# Patient Record
Sex: Male | Born: 1960 | Race: White | Hispanic: No | Marital: Married | Smoking: Former smoker
Health system: Southern US, Community
[De-identification: ages and names within clinical notes are randomized; demographics above are authoritative.]

## PROBLEM LIST (undated history)

## (undated) DIAGNOSIS — D72829 Elevated white blood cell count, unspecified: Secondary | ICD-10-CM

## (undated) DIAGNOSIS — J449 Chronic obstructive pulmonary disease, unspecified: Secondary | ICD-10-CM

## (undated) HISTORY — DX: Elevated white blood cell count, unspecified: D72.829

---

## 2009-07-02 ENCOUNTER — Ambulatory Visit: Admission: RE | Admit: 2009-07-02 | Discharge: 2009-07-02 | Payer: Self-pay | Admitting: Family Medicine

## 2012-11-17 ENCOUNTER — Ambulatory Visit (INDEPENDENT_AMBULATORY_CARE_PROVIDER_SITE_OTHER): Payer: BC Managed Care – PPO | Admitting: Physician Assistant

## 2012-11-17 ENCOUNTER — Ambulatory Visit (INDEPENDENT_AMBULATORY_CARE_PROVIDER_SITE_OTHER): Payer: BC Managed Care – PPO

## 2012-11-17 ENCOUNTER — Encounter: Payer: Self-pay | Admitting: Physician Assistant

## 2012-11-17 VITALS — BP 119/76 | HR 86 | Temp 99.3°F | Ht 68.0 in | Wt <= 1120 oz

## 2012-11-17 DIAGNOSIS — D72829 Elevated white blood cell count, unspecified: Secondary | ICD-10-CM

## 2012-11-17 DIAGNOSIS — F17200 Nicotine dependence, unspecified, uncomplicated: Secondary | ICD-10-CM

## 2012-11-17 DIAGNOSIS — IMO0001 Reserved for inherently not codable concepts without codable children: Secondary | ICD-10-CM

## 2012-11-17 NOTE — Progress Notes (Signed)
Subjective:     Patient ID: George Eaton, male   DOB: [birth year], 52 y.o.   MRN: [id number]  HPI Pt here for follow up He has been seen regular through the clinic set up at his work place They have noted elevated white count for several months and the pt states he has been told this for years He denies night sweats, weight gain/loss, or any swollen glands No know dental problems and pt has full dentures He has smoked since the age of 89 and is currently ~ 1 1/2 pk per day No FH of blood conditions  Review of Systems  All other systems reviewed and are negative.       Objective:   Physical Exam  Nursing note and vitals reviewed. Oral- no lesions No cerv nodes No bruits Heart- RRR w/o M Lungs- CTA CXR- chronic change, will be over-read Reviewed labs pt brings to appt- WBC count between 13-16     Assessment:     1. Leukocytosis, unspecified   2. Smoking        Plan:     Given chronic nature and no definite cause refer to hematology Pt prefers [place] F/U pending referral

## 2012-11-17 NOTE — Patient Instructions (Signed)
Leukocytosis Leukocytosis means you have more white blood cells than normal. White blood cells are made in your bone marrow. The main job of white blood cells is to fight infection. Having too many white blood cells is a common condition. It can develop as a result of many types of medical problems. CAUSES  In some cases, your bone marrow may be normal, but it is still making too many white blood cells. This could be the result of:  Infection.  Injury.  Physical stress.  Emotional stress.  Surgery.  Allergic reactions.  Tumors that do not start in the blood or bone marrow.  An inherited disease.  Certain medicines.  Pregnancy and labor. In other cases, you may have a bone marrow disorder that is causing your body to make too many white blood cells. Bone marrow disorders include:  Leukemia. This is a type of blood cancer.  Myeloproliferative disorders. These disorders cause blood cells to grow abnormally. SYMPTOMS  Some people have no symptoms. Others have symptoms due to the medical problem that is causing their leukocytosis. These symptoms may include:  Bleeding.  Bruising.  Fever.  Night sweats.  Repeated infections.  Weakness.  Weight loss. DIAGNOSIS  Leukocytosis is often found during blood tests that are done as part of a normal physical exam. Your caregiver will probably order other tests to help determine why you have too many white blood cells. These tests may include:  A complete blood count (CBC). This test measures all the types of blood cells in your body.  Chest X-rays, urine tests (urinalysis), or other tests to look for signs of infection.  Bone marrow aspiration. For this test, a needle is put into your bone. Cells from the bone marrow are removed through the needle. The cells are then examined under a microscope. TREATMENT  Treatment is usually not needed for leukocytosis. However, if a disorder is causing your leukocytosis, it will need to be  treated. Treatment may include:  Antibiotic medicines if you have a bacterial infection.  Bone marrow transplant. Your diseased bone marrow is replaced with healthy cells that will grow new bone marrow.  Chemotherapy. This is the use of drugs to kill cancer cells. HOME CARE INSTRUCTIONS  Only take over-the-counter or prescription medicines as directed by your caregiver.  Maintain a healthy weight. Ask your caregiver what weight is best for you.  Eat foods that are low in saturated fats and high in fiber. Eat plenty of fruits and vegetables.  Drink enough fluids to keep your urine clear or pale yellow.  Get 30 minutes of exercise at least 5 times a week. Check with your caregiver before starting a new exercise routine.  Limit caffeine and alcohol.  Do not smoke.  Keep all follow-up appointments as directed by your caregiver. SEEK MEDICAL CARE IF:  You feel weak or more tired than usual.  You develop chills, a cough, or nasal congestion.  You lose weight without trying.  You have night sweats.  You bruise easily. SEEK IMMEDIATE MEDICAL CARE IF:  You bleed more than normal.  You have chest pain.  You have trouble breathing.  You have a fever.  You have uncontrolled nausea or vomiting.  You feel dizzy or lightheaded. MAKE SURE YOU:  Understand these instructions.  Will watch your condition.  Will get help right away if you are not doing well or get worse. Document Released: 05/15/2011 Document Revised: 08/18/2011 Document Reviewed: 05/15/2011 [employer] Patient Information 2014 [employer].  

## 2012-12-17 ENCOUNTER — Encounter (HOSPITAL_COMMUNITY): Payer: Self-pay

## 2012-12-17 ENCOUNTER — Encounter (HOSPITAL_COMMUNITY): Payer: BC Managed Care – PPO | Attending: Oncology

## 2012-12-17 VITALS — BP 127/86 | HR 94 | Temp 98.6°F | Resp 18 | Ht 67.0 in | Wt 229.0 lb

## 2012-12-17 DIAGNOSIS — D72829 Elevated white blood cell count, unspecified: Secondary | ICD-10-CM

## 2012-12-17 NOTE — Patient Instructions (Addendum)
.  [facility] Discharge Instructions  RECOMMENDATIONS MADE BY THE CONSULTANT AND ANY TEST RESULTS WILL BE SENT TO YOUR REFERRING PHYSICIAN.  EXAM FINDINGS BY THE PHYSICIAN TODAY AND SIGNS OR SYMPTOMS TO REPORT TO CLINIC OR PRIMARY PHYSICIAN:  Lab work to be done Monday 7/14 at 0930 SPECIAL INSTRUCTIONS/FOLLOW-UP: Return in 1 month for labs and to see our PA.  Thank you for choosing [facility] to provide your oncology and hematology care.  To afford each patient quality time with our providers, please arrive at least 15 minutes before your scheduled appointment time.  With your help, our goal is to use those 15 minutes to complete the necessary work-up to ensure our physicians have the information they need to help with your evaluation and healthcare recommendations.    Effective January 1st, 2014, we ask that you re-schedule your appointment with our physicians should you arrive 10 or more minutes late for your appointment.  We strive to give you quality time with our providers, and arriving late affects you and other patients whose appointments are after yours.    Again, thank you for choosing [facility].  Our hope is that these requests will decrease the amount of time that you wait before being seen by our physicians.       _____________________________________________________________  Should you have questions after your visit to [facility], please contact our office at [phone] between the hours of 8:30 a.m. and 5:00 p.m.  Voicemails left after 4:30 p.m. will not be returned until the following business day.  For prescription refill requests, have your pharmacy contact our office with your prescription refill request.

## 2012-12-20 ENCOUNTER — Encounter (HOSPITAL_BASED_OUTPATIENT_CLINIC_OR_DEPARTMENT_OTHER): Payer: BC Managed Care – PPO

## 2012-12-20 DIAGNOSIS — D72829 Elevated white blood cell count, unspecified: Secondary | ICD-10-CM

## 2012-12-20 LAB — CBC
HCT: 45.7 % (ref 39.0–52.0)
Hemoglobin: 16.1 g/dL (ref 13.0–17.0)
MCV: 88.7 fL (ref 78.0–100.0)
RDW: 13 % (ref 11.5–15.5)
WBC: 12.3 10*3/uL — ABNORMAL HIGH (ref 4.0–10.5)

## 2012-12-20 NOTE — Progress Notes (Signed)
George Eaton's reason for visit today are for labs as scheduled per MD orders.  Venipuncture performed with a 23 gauge butterfly needle to R Antecubital.  George Eaton tolerated venipuncture well and without incident; questions were answered and patient was discharged.

## 2012-12-20 NOTE — Progress Notes (Signed)
Chief complaint: Persistent leukocytosis.  History of present illness:   Patient is a 52 year old male with no significant past medical history is found to have a mildly elevated white blood cell count on routine blood work.  Repeat lab draw is confirmed the results.  Currently, he feels well and is asymptomatic.  He has no neurologic complaints.  He denies any recent fevers or illnesses.  He is a good appetite and denies weight loss.  He has no night sweats.  He denies any chest pain or shortness of breath.  He denies any nausea, vomiting, constipation, or diarrhea.  He has no melena or hematochezia.  He has no urinary complaints.  Patient feels at his baseline and offers no specific complaints today.  Active Ambulatory Problems    Diagnosis Date Noted  . No Active Ambulatory Problems   Resolved Ambulatory Problems    Diagnosis Date Noted  . No Resolved Ambulatory Problems   No Additional Past Medical History    As per HPI. Otherwise, 10 point system review was negative.   Filed Vitals:   12/17/12 1434  BP: 127/86  Pulse: 94  Temp: 98.6 F (37 C)  Resp: 18    CBC    Component Value Date/Time   WBC 12.3* 12/20/2012 0911   RBC 5.15 12/20/2012 0911   HGB 16.1 12/20/2012 0911   HCT 45.7 12/20/2012 0911   PLT 287 12/20/2012 0911   MCV 88.7 12/20/2012 0911   MCH 31.3 12/20/2012 0911   MCHC 35.2 12/20/2012 0911   RDW 13.0 12/20/2012 0911     Assessment and plan:  1.  Leukocytosis: Patient's white blood cell count continues to be mildly elevated.  The remainder of his laboratory work from today was within normal limits.  Because of the neutrophil predominance, have ordered a BCR/abl gene rearrangement to assess for underlying CML.  This is most likely reactive in nature possibly secondary to his heavy tobacco use.  No intervention is needed at this time.  Patient does not require bone marrow biopsy at this time.  Return to clinic in 1 month with repeat laboratory work and further  evaluation.  If his BCR-abl gene rearrangement is negative, he likely can be discharged from clinic.  Patient expressed understanding and was in agreement with this plan.

## 2012-12-22 LAB — BCR/ABL GENE REARRANGEMENT QNT, PCR: BCR ABL1 / ABL1 IS: 0 %

## 2013-01-20 ENCOUNTER — Encounter (HOSPITAL_BASED_OUTPATIENT_CLINIC_OR_DEPARTMENT_OTHER): Payer: BC Managed Care – PPO

## 2013-01-20 ENCOUNTER — Encounter (HOSPITAL_COMMUNITY): Payer: BC Managed Care – PPO | Attending: Oncology | Admitting: Oncology

## 2013-01-20 ENCOUNTER — Encounter (HOSPITAL_COMMUNITY): Payer: Self-pay | Admitting: Oncology

## 2013-01-20 VITALS — BP 141/90 | HR 92 | Temp 97.8°F | Resp 18 | Wt 234.6 lb

## 2013-01-20 DIAGNOSIS — D72829 Elevated white blood cell count, unspecified: Secondary | ICD-10-CM

## 2013-01-20 DIAGNOSIS — F17200 Nicotine dependence, unspecified, uncomplicated: Secondary | ICD-10-CM

## 2013-01-20 HISTORY — DX: Elevated white blood cell count, unspecified: D72.829

## 2013-01-20 LAB — CBC
HCT: 45.7 % (ref 39.0–52.0)
Hemoglobin: 15.8 g/dL (ref 13.0–17.0)
MCV: 89.4 fL (ref 78.0–100.0)
RDW: 13.2 % (ref 11.5–15.5)
WBC: 10.4 10*3/uL (ref 4.0–10.5)

## 2013-01-20 NOTE — Progress Notes (Signed)
George Heap, MD [address] [place] [address]  Leukocytosis  CURRENT THERAPY: Work-up  INTERVAL HISTORY: George Eaton 52 y.o. male returns for  regular  visit for followup of persistent leukocytosis.  George Eaton denies any B symptoms.  He denies any fevers, chills, night sweats, nausea, vomiting, early satiety, unintentional weight loss. He feels well.   He reports that in the distant past he was told that he has COPD, but I do not have any radiographic evidence of that in [facility].   I personally reviewed and went over laboratory results with the patient.  His WBC is WNL most recently at 10.4 with a normal Hgb 15.8 g/dL and platelet count of 604,540.  His BCR/ABL was negative.    I have reviewed Dr. Milinda Cave note last month.  I personally reviewed and went over radiographic studies with the patient.  His last Chest Xray was negative for any acute findings.   The majority of our visit today was smoking cessation education.  I provided him education regarding the relationship of leukocytosis and smoking.   He is showing early signs of clubbing and I provided him education regarding this effect of smoking abuse.   We will release the patient from the clinic as their is not an oncologic or hematologic present at this time.  He is to follow-up with his PCP as directed.   Past Medical History  Diagnosis Date  . Leukocytosis 01/20/2013    has Leukocytosis on his problem list.     has No Known Allergies.  [name] does not currently have medications on file.  History reviewed. No pertinent past surgical history.  Denies any headaches, dizziness, double vision, fevers, chills, night sweats, nausea, vomiting, diarrhea, constipation, chest pain, heart palpitations, shortness of breath, blood in stool, black tarry stool, urinary pain, urinary burning, urinary frequency, hematuria.   PHYSICAL EXAMINATION  ECOG PERFORMANCE STATUS: 0 - Asymptomatic  Filed Vitals:   01/20/13 0800  BP: 141/90  Pulse: 92  Temp: 97.8 F (36.6 C)  Resp: 18    GENERAL:alert, no distress, well nourished, well developed, comfortable, cooperative, obese and smiling SKIN: skin color, texture, turgor are normal, no rashes or significant lesions HEAD: Normocephalic, No masses, lesions, tenderness or abnormalities EYES: normal, PERRLA, EOMI, Conjunctiva are pink and non-injected EARS: External ears normal OROPHARYNX:mucous membranes are moist  NECK: supple, no adenopathy, thyroid normal size, non-tender, without nodularity, no stridor, non-tender, trachea midline LYMPH:  no palpable lymphadenopathy BREAST:not examined LUNGS: clear to auscultation and percussion, decreased breath sounds and hyperresonant to percussion HEART: regular rate & rhythm, no murmurs, no gallops, S1 normal and S2 normal ABDOMEN:abdomen soft, non-tender, obese and normal bowel sounds BACK: Back symmetric, no curvature. EXTREMITIES:less then 2 second capillary refill, no joint deformities, effusion, or inflammation, no edema, no skin discoloration, no cyanosis, positive findings:  Early clubbing of fingernails  NEURO: alert & oriented x 3 with fluent speech, no focal motor/sensory deficits, gait normal    LABORATORY DATA: CBC    Component Value Date/Time   WBC 10.4 01/20/2013 0844   RBC 5.11 01/20/2013 0844   HGB 15.8 01/20/2013 0844   HCT 45.7 01/20/2013 0844   PLT 272 01/20/2013 0844   MCV 89.4 01/20/2013 0844   MCH 30.9 01/20/2013 0844   MCHC 34.6 01/20/2013 0844   RDW 13.2 01/20/2013 0844    BCR/ABL Source  REPORT   Comments: Peripheral Blood BCR ABL1 / ABL1 %  0.000   BCR ABL1 /  ABL1 IS %  0.000   Interpretation - BCRQ  REPORT   Comments: (NOTE) The P190 and P210 BCR-ABL1 fusion transcripts are NOT detected.     RADIOGRAPHIC STUDIES:  11/17/2012  *RADIOLOGY REPORT*  Clinical Data: Elevated WBC, smoker  CHEST - 2 VIEW  Comparison: None.  Findings: Cardiomediastinal silhouette  is unremarkable. No acute  infiltrate or pleural effusion. No pulmonary edema. Mild  degenerative changes thoracic spine.  IMPRESSION:  No active disease.  Clinically significant discrepancy from primary report, if  provided: None  Original Report Authenticated By: Natasha Mead, M.D.     ASSESSMENT:  1. Leukocytosis, WNL (upper normal limits) most recently on 01/20/2013.  Reactive to #2 2. Tobacco abuse, 1.5 ppd   PLAN:  1. I personally reviewed and went over laboratory results with the patient. 2. I personally reviewed and went over radiographic studies with the patient. 3. Smoking cessation education provided.  4. Patient education regarding leukocytosis 5. Release from clinic.   THERAPY PLAN:  Will release the patient from clinic with negative work-up for leukocytosis with neutrophilia.  He is to follow with PCP as directed.   All questions were answered. The patient knows to call the clinic with any problems, questions or concerns. We can certainly see the patient much sooner if necessary.  Patient and plan discussed with Dr. Benita Gutter and he is in agreement with the aforementioned.    KEFALAS,THOMAS

## 2013-01-20 NOTE — Patient Instructions (Addendum)
[facility] Discharge Instructions  RECOMMENDATIONS MADE BY THE CONSULTANT AND ANY TEST RESULTS WILL BE SENT TO YOUR REFERRING PHYSICIAN.  Your white cell counts have returned to normal. All other lab tests have been negative. PLEASE try to QUIT SMOKING. We will release you from our clinic. Please keep [place] in mind if you are ever in need of our services again.  Smoking Cessation Quitting smoking is important to your health and has many advantages. However, it is not always easy to quit since nicotine is a very addictive drug. Often times, people try 3 times or more before being able to quit. This document explains the best ways for you to prepare to quit smoking. Quitting takes hard work and a lot of effort, but you can do it. ADVANTAGES OF QUITTING SMOKING  You will live longer, feel better, and live better.  Your body will feel the impact of quitting smoking almost immediately.  Within 20 minutes, blood pressure decreases. Your pulse returns to its normal level.  After 8 hours, carbon monoxide levels in the blood return to normal. Your oxygen level increases.  After 24 hours, the chance of having a heart attack starts to decrease. Your breath, hair, and body stop smelling like smoke.  After 48 hours, damaged nerve endings begin to recover. Your sense of taste and smell improve.  After 72 hours, the body is virtually free of nicotine. Your bronchial tubes relax and breathing becomes easier.  After 2 to 12 weeks, lungs can hold more air. Exercise becomes easier and circulation improves.  The risk of having a heart attack, stroke, cancer, or lung disease is greatly reduced.  After 1 year, the risk of coronary heart disease is cut in half.  After 5 years, the risk of stroke falls to the same as a nonsmoker.  After 10 years, the risk of lung cancer is cut in half and the risk of other cancers decreases significantly.  After 15 years, the risk of coronary heart  disease drops, usually to the level of a nonsmoker.  If you are pregnant, quitting smoking will improve your chances of having a healthy baby.  The people you live with, especially any children, will be healthier.  You will have extra money to spend on things other than cigarettes. QUESTIONS TO THINK ABOUT BEFORE ATTEMPTING TO QUIT You may want to talk about your answers with your caregiver.  Why do you want to quit?  If you tried to quit in the past, what helped and what did not?  What will be the most difficult situations for you after you quit? How will you plan to handle them?  Who can help you through the tough times? Your family? Friends? A caregiver?  What pleasures do you get from smoking? What ways can you still get pleasure if you quit? Here are some questions to ask your caregiver:  How can you help me to be successful at quitting?  What medicine do you think would be best for me and how should I take it?  What should I do if I need more help?  What is smoking withdrawal like? How can I get information on withdrawal? GET READY  Set a quit date.  Change your environment by getting rid of all cigarettes, ashtrays, matches, and lighters in your home, car, or work. Do not let people smoke in your home.  Review your past attempts to quit. Think about what worked and what did not. GET SUPPORT AND  ENCOURAGEMENT You have a better chance of being successful if you have help. You can get support in many ways.  Tell your family, friends, and co-workers that you are going to quit and need their support. Ask them not to smoke around you.  Get individual, group, or telephone counseling and support. Programs are available at [employer] and health centers. Call your local health department for information about programs in your area.  Spiritual beliefs and practices may help some smokers quit.  Download a "quit meter" on your computer to keep track of quit statistics, such  as how long you have gone without smoking, cigarettes not smoked, and money saved.  Get a self-help book about quitting smoking and staying off of tobacco. LEARN Merlean Pizzini SKILLS AND BEHAVIORS  Distract yourself from urges to smoke. Talk to someone, go for a walk, or occupy your time with a task.  Change your normal routine. Take a different route to work. Drink tea instead of coffee. Eat breakfast in a different place.  Reduce your stress. Take a hot bath, exercise, or read a book.  Plan something enjoyable to do every day. Reward yourself for not smoking.  Explore interactive web-based programs that specialize in helping you quit. GET MEDICINE AND USE IT CORRECTLY Medicines can help you stop smoking and decrease the urge to smoke. Combining medicine with the above behavioral methods and support can greatly increase your chances of successfully quitting smoking.  Nicotine replacement therapy helps deliver nicotine to your body without the negative effects and risks of smoking. Nicotine replacement therapy includes nicotine gum, lozenges, inhalers, nasal sprays, and skin patches. Some may be available over-the-counter and others require a prescription.  Antidepressant medicine helps people abstain from smoking, but how this works is unknown. This medicine is available by prescription.  Nicotinic receptor partial agonist medicine simulates the effect of nicotine in your brain. This medicine is available by prescription. Ask your caregiver for advice about which medicines to use and how to use them based on your health history. Your caregiver will tell you what side effects to look out for if you choose to be on a medicine or therapy. Carefully read the information on the package. Do not use any other product containing nicotine while using a nicotine replacement product.  RELAPSE OR DIFFICULT SITUATIONS Most relapses occur within the first 3 months after quitting. Do not be discouraged if you start  smoking again. Remember, most people try several times before finally quitting. You may have symptoms of withdrawal because your body is used to nicotine. You may crave cigarettes, be irritable, feel very hungry, cough often, get headaches, or have difficulty concentrating. The withdrawal symptoms are only temporary. They are strongest when you first quit, but they will go away within 10 14 days. To reduce the chances of relapse, try to:  Avoid drinking alcohol. Drinking lowers your chances of successfully quitting.  Reduce the amount of caffeine you consume. Once you quit smoking, the amount of caffeine in your body increases and can give you symptoms, such as a rapid heartbeat, sweating, and anxiety.  Avoid smokers because they can make you want to smoke.  Do not let weight gain distract you. Many smokers will gain weight when they quit, usually less than 10 pounds. Eat a healthy diet and stay active. You can always lose the weight gained after you quit.  Find ways to improve your mood other than smoking. FOR MORE INFORMATION  www.smokefree.gov  Document Released: 05/20/2001 Document Revised:  11/25/2011 Document Reviewed: 09/04/2011 ExitCare Patient Information 2014 [place], [place].   Thank you for choosing [facility] to provide your oncology and hematology care.  To afford each patient quality time with our providers, please arrive at least 15 minutes before your scheduled appointment time.  With your help, our goal is to use those 15 minutes to complete the necessary work-up to ensure our physicians have the information they need to help with your evaluation and healthcare recommendations.    Effective January 1st, 2014, we ask that you re-schedule your appointment with our physicians should you arrive 10 or more minutes late for your appointment.  We strive to give you quality time with our providers, and arriving late affects you and other patients whose appointments are after  yours.    Again, thank you for choosing [facility].  Our hope is that these requests will decrease the amount of time that you wait before being seen by our physicians.       _____________________________________________________________  Should you have questions after your visit to [facility], please contact our office at [phone] between the hours of 8:30 a.m. and 5:00 p.m.  Voicemails left after 4:30 p.m. will not be returned until the following business day.  For prescription refill requests, have your pharmacy contact our office with your prescription refill request.

## 2013-01-20 NOTE — Progress Notes (Signed)
Labs drawn today for cbc 

## 2014-10-06 IMAGING — CR DG CHEST 2V
2 series · 2 of 2 positions shown · non-contrast
Comparison: None.

CLINICAL DATA: Elevated WBC, smoker

CHEST - 2 VIEW

[view not recorded (1 of 2)]
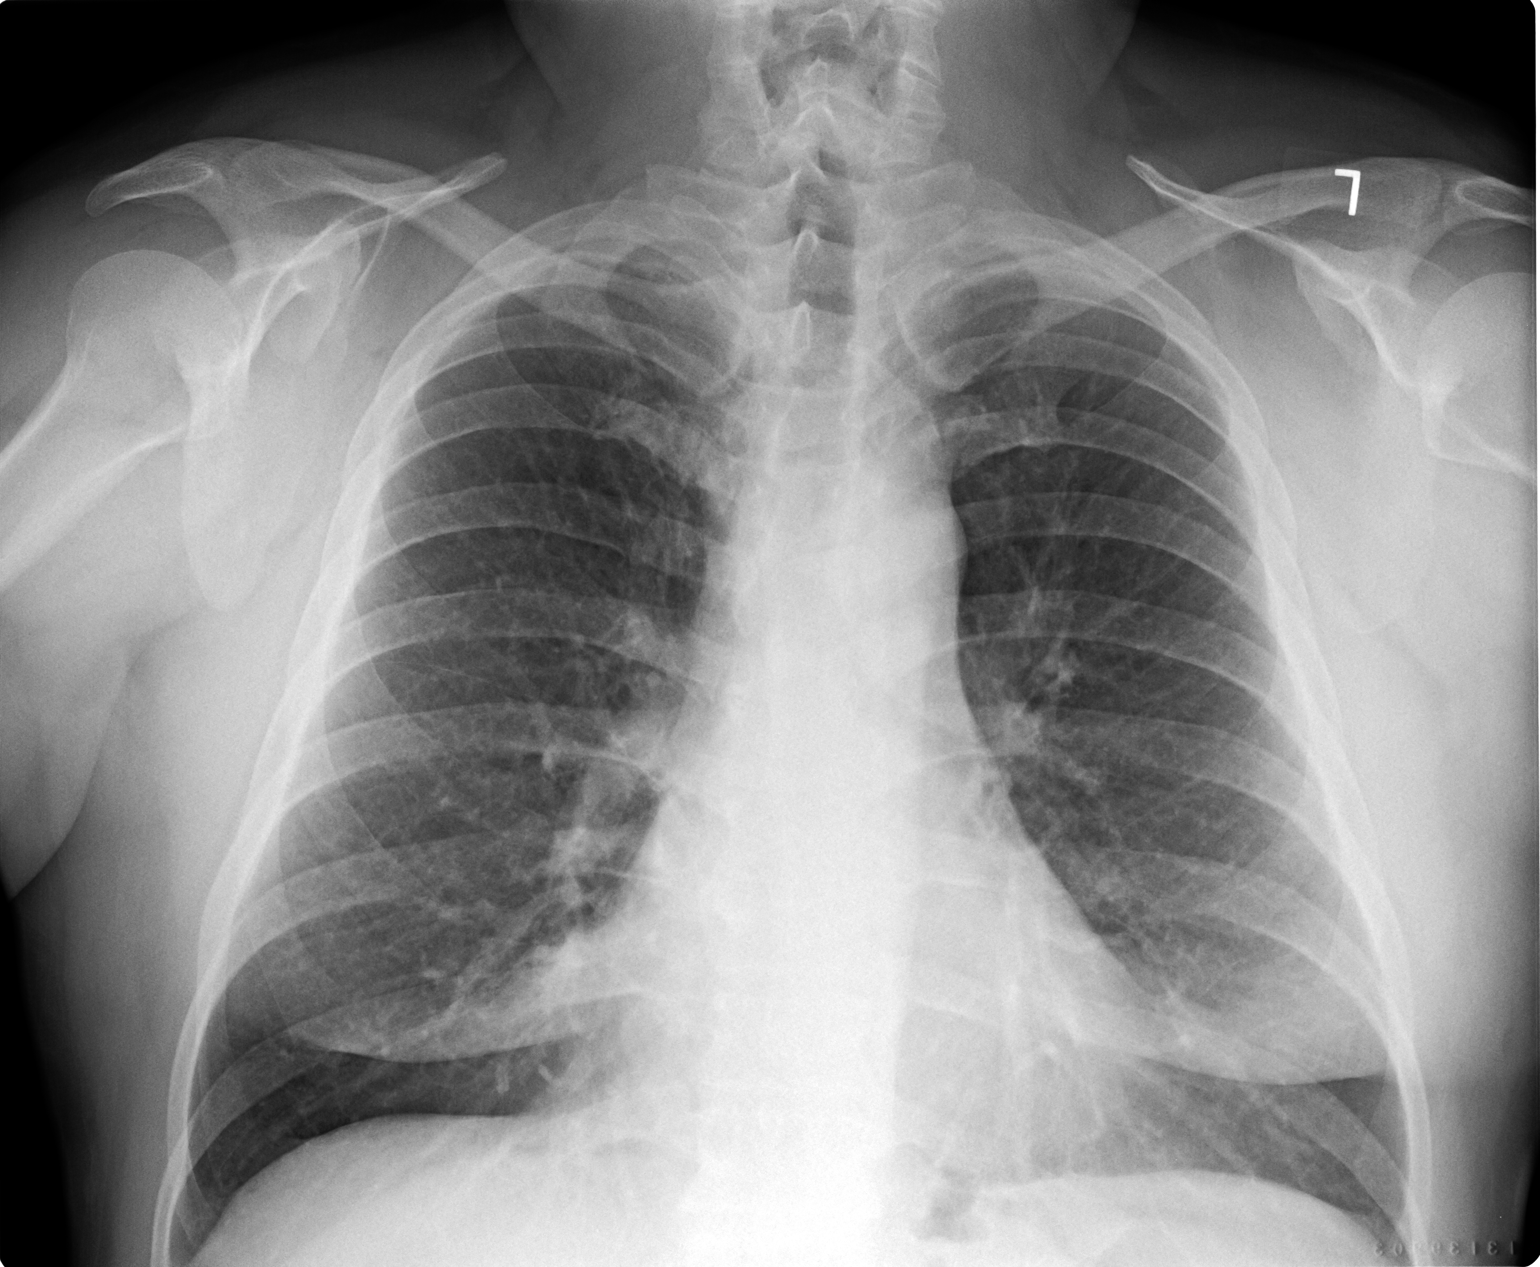

[view not recorded (2 of 2)]
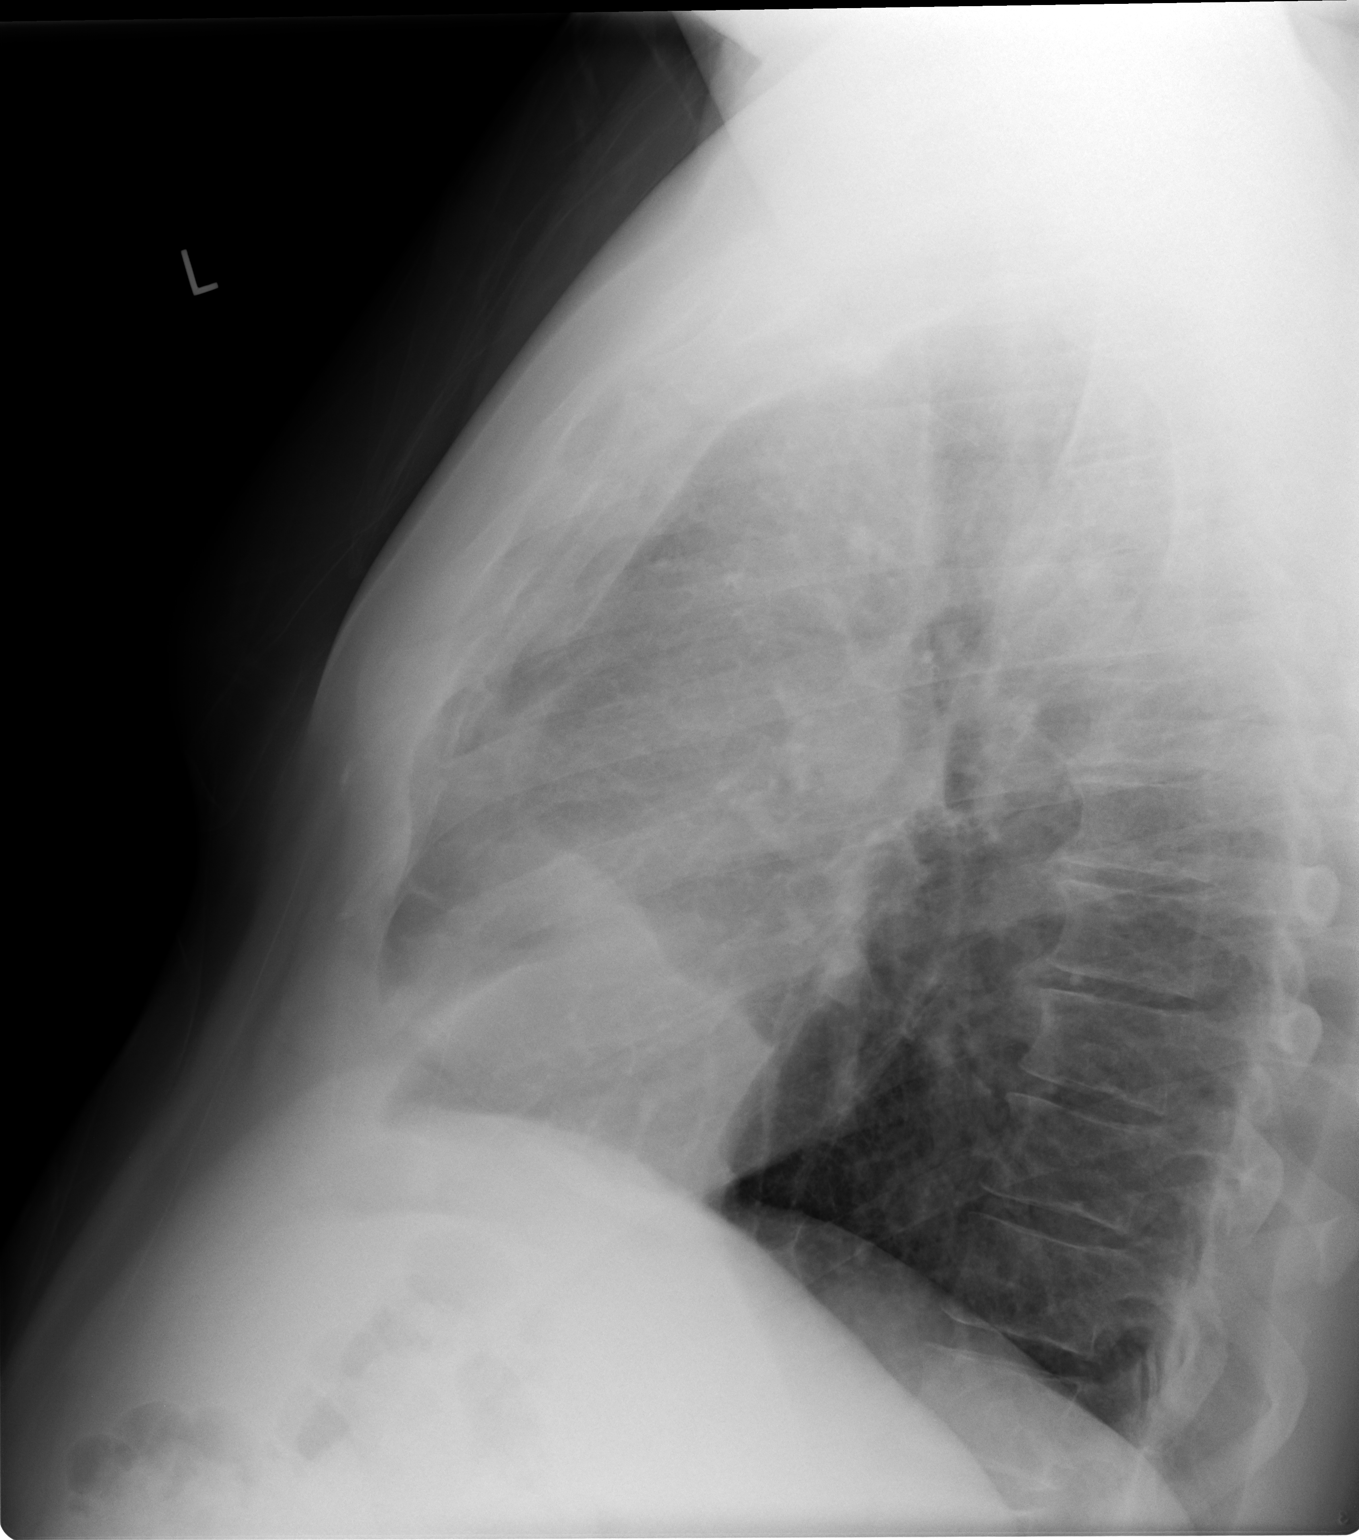

[2 of 2 positions shown; findings below may reference images not displayed]

FINDINGS: Cardiomediastinal silhouette is unremarkable.  No acute
infiltrate or pleural effusion.  No pulmonary edema.  Mild
degenerative changes thoracic spine.
IMPRESSION: No active disease.

Clinically significant discrepancy from primary report, if
provided: None

## 2020-09-03 ENCOUNTER — Ambulatory Visit (INDEPENDENT_AMBULATORY_CARE_PROVIDER_SITE_OTHER): Payer: 59

## 2020-09-03 ENCOUNTER — Encounter: Payer: Self-pay | Admitting: Emergency Medicine

## 2020-09-03 ENCOUNTER — Ambulatory Visit
Admission: EM | Admit: 2020-09-03 | Discharge: 2020-09-03 | Disposition: A | Discharge status: Home / Self Care | Payer: 59 | Attending: Family Medicine | Admitting: Family Medicine

## 2020-09-03 DIAGNOSIS — I10 Essential (primary) hypertension: Secondary | ICD-10-CM | POA: Diagnosis not present

## 2020-09-03 DIAGNOSIS — R000 Tachycardia, unspecified: Secondary | ICD-10-CM

## 2020-09-03 DIAGNOSIS — R0902 Hypoxemia: Secondary | ICD-10-CM

## 2020-09-03 DIAGNOSIS — R062 Wheezing: Secondary | ICD-10-CM

## 2020-09-03 DIAGNOSIS — R7981 Abnormal blood-gas level: Secondary | ICD-10-CM

## 2020-09-03 DIAGNOSIS — I517 Cardiomegaly: Secondary | ICD-10-CM

## 2020-09-03 DIAGNOSIS — G4733 Obstructive sleep apnea (adult) (pediatric): Secondary | ICD-10-CM

## 2020-09-03 DIAGNOSIS — J441 Chronic obstructive pulmonary disease with (acute) exacerbation: Secondary | ICD-10-CM

## 2020-09-03 HISTORY — DX: Chronic obstructive pulmonary disease, unspecified: J44.9

## 2020-09-03 MED ORDER — FUROSEMIDE 40 MG PO TABS: 40.0000 mg | ORAL_TABLET | Freq: Every day | ORAL | 0 refills | Status: AC

## 2020-09-03 MED ORDER — IPRATROPIUM-ALBUTEROL 0.5-2.5 (3) MG/3ML IN SOLN: 3.0000 mL | Freq: Four times a day (QID) | RESPIRATORY_TRACT | 0 refills | Status: AC | PRN

## 2020-09-03 MED ORDER — PREDNISONE 20 MG PO TABS: 40.0000 mg | ORAL_TABLET | Freq: Every day | ORAL | 0 refills | Status: DC

## 2020-09-03 MED ORDER — BENZONATATE 100 MG PO CAPS: 100.0000 mg | ORAL_CAPSULE | Freq: Three times a day (TID) | ORAL | 0 refills | Status: AC | PRN

## 2020-09-03 MED ORDER — CARVEDILOL 3.125 MG PO TABS: 3.1250 mg | ORAL_TABLET | Freq: Two times a day (BID) | ORAL | 0 refills | Status: AC

## 2020-09-03 NOTE — Discharge Instructions (Signed)
[facility] will contact you to schedule an appointment. Please ensure that you go for evaluation as enlargement of your heart is a new finding on x-ray and with your history of sleep apnea, need to rule out that your heart is not contributing to low oxygen level today.  I am also referring you to Lung specialist and primary care provider as you need lung functioning test and a complete physical.  Start neb treatments every 6 hours as needed for wheezing and or shortness of breath. You also need to purchase an oxygen level monitor at home . Your oxygen level should not drop and continue at less than 88% if this is occurring your body is not achieving adequate oxygen and this indicates a need to go to the emergency department.

## 2020-09-03 NOTE — ED Triage Notes (Signed)
Cough and shortness of breath since Saturday.  Pt was around sister recently that has bronchitis.

## 2020-09-03 NOTE — ED Provider Notes (Signed)
[facility]    CSN: [id number] Arrival date & time: 09/03/20  8416      History   Chief Complaint No chief complaint on file.   HPI George Eaton is a 60 y.o. male.   HPI On arrival oxygen level 85 % and wheezing diffusely. Improved after 2 liters of oxgen and 2 puffs of albuterol. Endorses a history of mild hypertension (never treated), obesity, OSA (prescribed CPAP which he never used). Endorses chronic SOB with activity only intermittently until the last two days, which SOB has been more persistent and worsening of baseline. He endorses intermittent swelling involving the lower extremities that comes and goes. He was diagnosed with COPD several years back and discontinued smoking 5 years ago. He has never required preventative maintenance treatment for COPD.  Current course of illness began x 1 day ago. Patient has experienced wheezing, persistent cough, and worsening SOB. He was in close contact with sister who has URI illness and he was concerned he acquired his illness from her.  Afebrile. Denies body aches.   Past Medical History:  Diagnosis Date   COPD (chronic obstructive pulmonary disease) (HCC)    Leukocytosis 01/20/2013    Patient Active Problem List   Diagnosis Date Noted   Leukocytosis 01/20/2013    History reviewed. No pertinent surgical history.     Home Medications    Prior to Admission medications   Medication Sig Start Date End Date Taking? Authorizing Provider  aspirin 81 MG tablet Take 81 mg by mouth daily.    [provider]  fish oil-omega-3 fatty acids 1000 MG capsule Take 2 g by mouth daily.    [provider]    Family History Family History  Problem Relation Age of Onset   Healthy Mother    Diabetes Father     Social History Social History   Tobacco Use   Smoking status: Former Smoker    Quit date: 09/04/2014    Years since quitting: 6.0   Smokeless tobacco: Never Used  Substance Use Topics    Alcohol use: No   Drug use: No     Allergies   Patient has no known allergies.   Review of Systems Review of Systems Pertinent negatives listed in HPI   Physical Exam Triage Vital Signs ED Triage Vitals  Enc Vitals Group     BP 09/03/20 1009 (!) 164/99     Pulse Rate 09/03/20 1009 (!) 112     Resp 09/03/20 1009 (!) 22     Temp 09/03/20 1009 98.4 F (36.9 C)     Temp Source 09/03/20 1009 Oral     SpO2 09/03/20 1009 90 %     Weight --      Height --      Head Circumference --      Peak Flow --      Pain Score 09/03/20 1010 0     Pain Loc --      Pain Edu? --      Excl. in GC? --    No data found.  Updated Vital Signs BP (!) 164/99 (BP Location: Right Arm)    Pulse (!) 112    Temp 98.4 F (36.9 C) (Oral)    Resp (!) 22    SpO2 90%   Visual Acuity Right Eye Distance:   Left Eye Distance:   Bilateral Distance:    Right Eye Near:   Left Eye Near:    Bilateral Near:  Physical Exam Constitutional:      Appearance: Normal appearance.  HENT:     Head: Normocephalic.     Nose: Nose normal.  Cardiovascular:     Rate and Rhythm: Tachycardia present.  Pulmonary:     Effort: Tachypnea and prolonged expiration present.     Breath sounds: Wheezing and rhonchi present.  Abdominal:     General: There is distension.     Tenderness: There is no abdominal tenderness.  Musculoskeletal:     Right lower leg: 2+ Edema present.     Left lower leg: 2+ Edema present.  Skin:    Capillary Refill: Capillary refill takes less than 2 seconds.  Neurological:     General: No focal deficit present.     Mental Status: He is alert.  Psychiatric:        Mood and Affect: Mood normal.        Behavior: Behavior normal.        Thought Content: Thought content normal.        Judgment: Judgment normal.      UC Treatments / Results  Labs (all labs ordered are listed, but only abnormal results are displayed) Labs Reviewed  BASIC METABOLIC PANEL  BRAIN NATRIURETIC PEPTIDE   CBC    EKG: Sinus Tachycardia, 107, no ST changes   Radiology DG Chest 2 View  Result Date: 09/03/2020 CLINICAL DATA:  Wheezing and hypoxia.  Cough. EXAM: CHEST - 2 VIEW COMPARISON:  Two-view chest x-ray 11/07/2012 FINDINGS: The heart is mildly enlarged, stable. Diffuse central airway thickening is present. No focal airspace consolidation is present. No edema or effusion is present. IMPRESSION: 1. Stable cardiomegaly without failure. 2. Diffuse central airway thickening suggesting an acute viral process or reactive airways disease. Electronically Signed   By: Marin Roberts M.D.   On: 09/03/2020 10:30    Procedures Procedures (including critical care time)  Medications Ordered in UC Medications - No data to display  Initial Impression / Assessment and Plan / UC Course  I have reviewed the triage vital signs and the nursing notes.  Pertinent labs & imaging results that were available during my care of the patient were reviewed by me and considered in my medical decision making (see chart for details).    Patient presents today toxic with active diffuse wheezing and mild respiratory distress.  Patient given 2 puffs of albuterol inhaler while in clinic and also temporarily placed on 2 L of oxygen, he has significant improvement of symptoms after remain in the office for approximately 45 minutes.  Recheck oxygen level without supplemental O2 patient was maintaining oxygen level at 91 to 93%.  Chest x-ray shows stable cardiomegaly uncertain if this is a new finding however concerned this may be multifactorial given the patient was previously diagnosed with obstructive sleep apnea over 10 years ago and has not used a CPAP machine, obesity, uncontrolled unmanaged hypertension-there is concern for active cardiopulmonary disease.  Discussed in depth findings with patient and that he warrants further ongoing evaluation and management in the setting of both cardiology, pulmonary and he needs  close management and monitoring by primary care provider.   Recheck BP 136/98, Pulse 107, Oxygen 91%-93%  Treatment plan as follows: Hypertension, suspected fluid overload Carvedilol and Lasix   COPD: Nebulizer treatments with DUO nebs every 6 hours as needed for SOB or wheezing. Continue albuterol inhaler intermittently. Prednisone 40 mg daily x 5 days. Benzonatate 100-200 mg  Continue to monitor oxygen levels at home, red flag  precautions given. ER if symptoms worsen or do not improve  Final Clinical Impressions(s) / UC Diagnoses   Final diagnoses:  Elevated blood pressure reading in office with diagnosis of hypertension  COPD with acute exacerbation (HCC)  Cardiomegaly  OSA (obstructive sleep apnea) w/o CPAP  Borderline low oxygen saturation level  Tachycardia     Discharge Instructions     [facility] will contact you to schedule an appointment. Please ensure that you go for evaluation as enlargement of your heart is a new finding on x-ray and with your history of sleep apnea, need to rule out that your heart is not contributing to low oxygen level today.  I am also referring you to Lung specialist and primary care provider as you need lung functioning test and a complete physical.  Start neb treatments every 6 hours as needed for wheezing and or shortness of breath. You also need to purchase an oxygen level monitor at home . Your oxygen level should not drop and continue at less than 88% if this is occurring your body is not achieving adequate oxygen and this indicates a need to go to the emergency department.    ED Prescriptions    Medication Sig Dispense Auth. Provider   ipratropium-albuterol (DUONEB) 0.5-2.5 (3) MG/3ML SOLN Take 3 mLs by nebulization every 6 (six) hours as needed. 360 mL Bing Neighbors, FNP   furosemide (LASIX) 40 MG tablet Take 1 tablet (40 mg total) by mouth daily. 5 tablet Bing Neighbors, FNP   carvedilol (COREG) 3.125 MG tablet Take 1 tablet  (3.125 mg total) by mouth 2 (two) times daily with a meal. 30 tablet Bing Neighbors, FNP   predniSONE (DELTASONE) 20 MG tablet Take 2 tablets (40 mg total) by mouth daily with breakfast. 10 tablet Bing Neighbors, FNP   benzonatate (TESSALON) 100 MG capsule Take 1-2 capsules (100-200 mg total) by mouth 3 (three) times daily as needed for cough. 40 capsule Bing Neighbors, FNP     PDMP not reviewed this encounter.   Bing Neighbors, [place] 09/04/20 [phone]

## 2020-09-04 LAB — BASIC METABOLIC PANEL
BUN/Creatinine Ratio: 11 (ref 9–20)
BUN: 11 mg/dL (ref 6–24)
CO2: 19 mmol/L — ABNORMAL LOW (ref 20–29)
Calcium: 8.9 mg/dL (ref 8.7–10.2)
Chloride: 99 mmol/L (ref 96–106)
Creatinine, Ser: 1.03 mg/dL (ref 0.76–1.27)
Glucose: 123 mg/dL — ABNORMAL HIGH (ref 65–99)
Potassium: 4.1 mmol/L (ref 3.5–5.2)
Sodium: 137 mmol/L (ref 134–144)
eGFR: 84 mL/min/{1.73_m2} (ref >59)

## 2020-09-04 LAB — CBC
Hematocrit: 43.8 % (ref 37.5–51.0)
Hemoglobin: 15.3 g/dL (ref 13.0–17.7)
MCH: 29.7 pg (ref 26.6–33.0)
MCHC: 34.9 g/dL (ref 31.5–35.7)
MCV: 85 fL (ref 79–97)
Platelets: 240 10*3/uL (ref 150–450)
RBC: 5.15 x10E6/uL (ref 4.14–5.80)
RDW: 13 % (ref 11.6–15.4)
WBC: 9.4 10*3/uL (ref 3.4–10.8)

## 2020-09-04 LAB — BRAIN NATRIURETIC PEPTIDE

## 2020-09-11 NOTE — Progress Notes (Deleted)
CARDIOLOGY CONSULT NOTE       Patient ID: George Eaton MRN: [id number] DOB/AGE: [birth year] 60 y.o.  Referring Physician: Joaquin Courts FNP Primary Physician: Pcp, No Primary Cardiologist: new Reason for Consultation: Dyspnea  Active Problems:   * No active hospital problems. *   HPI:  59 y.o. referred by [facility] Joaquin Courts FNP for dyspnea: Seen in ER 09/03/20. Notes indicate he was fairly ill and likely should have been admitted Presented with hypoxic respiratory distress sats 85% Quit smoking 5 years ago but chronic dyspnea acutely worse 48 hours before ER visit. Obese with OSA but does not use CPAP. Increased wheezing/cough with sister having recent URI Has HTN and edema including abdominal distension Rx coreg and lasix. Given nebs, prednisone, tessalon In ER BP elevated and tachycardic afeb sats up to 89% on 2 L CXR showed CE without CHF no BNP done Diffuse central airway thickening suggesting acute viral process or reactive airways disease WBC and Hct normal Cr/K also normal    ROS All other systems reviewed and negative except as noted above  Past Medical History:  Diagnosis Date  . COPD (chronic obstructive pulmonary disease) (HCC)   . Leukocytosis 01/20/2013    Family History  Problem Relation Age of Onset  . Healthy Mother   . Diabetes Father     Social History   Socioeconomic History  . Marital status: Married    Spouse name: Not on file  . Number of children: Not on file  . Years of education: Not on file  . Highest education level: Not on file  Occupational History  . Not on file  Tobacco Use  . Smoking status: Former Smoker    Quit date: 09/04/2014    Years since quitting: 6.0  . Smokeless tobacco: Never Used  Substance and Sexual Activity  . Alcohol use: No  . Drug use: No  . Sexual activity: Not on file  Other Topics Concern  . Not on file  Social History Narrative  . Not on file   Social Determinants of Health   Financial  Resource Strain: Not on file  Food Insecurity: Not on file  Transportation Needs: Not on file  Physical Activity: Not on file  Stress: Not on file  Social Connections: Not on file  Intimate Partner Violence: Not on file    No past surgical history on file.    Current Outpatient Medications:  .  aspirin 81 MG tablet, Take 81 mg by mouth daily., Disp: , Rfl:  .  benzonatate (TESSALON) 100 MG capsule, Take 1-2 capsules (100-200 mg total) by mouth 3 (three) times daily as needed for cough., Disp: 40 capsule, Rfl: 0 .  carvedilol (COREG) 3.125 MG tablet, Take 1 tablet (3.125 mg total) by mouth 2 (two) times daily with a meal., Disp: 30 tablet, Rfl: 0 .  fish oil-omega-3 fatty acids 1000 MG capsule, Take 2 g by mouth daily., Disp: , Rfl:  .  furosemide (LASIX) 40 MG tablet, Take 1 tablet (40 mg total) by mouth daily., Disp: 5 tablet, Rfl: 0 .  ipratropium-albuterol (DUONEB) 0.5-2.5 (3) MG/3ML SOLN, Take 3 mLs by nebulization every 6 (six) hours as needed., Disp: 360 mL, Rfl: 0 .  predniSONE (DELTASONE) 20 MG tablet, Take 2 tablets (40 mg total) by mouth daily with breakfast., Disp: 10 tablet, Rfl: 0    Physical Exam: There were no vitals taken for this visit.   Affect appropriate Chronically ill HEENT: normal Neck supple with no  adenopathy JVP normal no bruits no thyromegaly Lungs diffuse rhonchi/wheezing  Heart:  S1/S2 no murmur, no rub, gallop or click PMI normal Abdomen: benighn, BS positve, no tenderness, no AAA no bruit.  No HSM or HJR Distal pulses intact with no bruits Plus 2 edema Neuro non-focal Skin warm and dry No muscular weakness   Labs:   Lab Results  Component Value Date   WBC 9.4 09/03/2020   HGB 15.3 09/03/2020   HCT 43.8 09/03/2020   MCV 85 09/03/2020   PLT 240 09/03/2020   No results for input(s): NA, K, CL, CO2, BUN, CREATININE, CALCIUM, PROT, BILITOT, ALKPHOS, ALT, AST, GLUCOSE in the last 168 hours.  Invalid input(s): LABALBU No results found for:  CKTOTAL, CKMB, CKMBINDEX, TROPONINI No results found for: CHOL No results found for: HDL No results found for: LDLCALC No results found for: TRIG No results found for: CHOLHDL No results found for: LDLDIRECT    Radiology: DG Chest 2 View  Result Date: 09/03/2020 CLINICAL DATA:  Wheezing and hypoxia.  Cough. EXAM: CHEST - 2 VIEW COMPARISON:  Two-view chest x-ray 11/07/2012 FINDINGS: The heart is mildly enlarged, stable. Diffuse central airway thickening is present. No focal airspace consolidation is present. No edema or effusion is present. IMPRESSION: 1. Stable cardiomegaly without failure. 2. Diffuse central airway thickening suggesting an acute viral process or reactive airways disease. Electronically Signed   By: Marin Roberts M.D.   On: 09/03/2020 10:30    EKG: ST rate 102 poor R wave progression 09/04/20    ASSESSMENT AND PLAN:   1. Dyspnea;   2. HTN 3. ***  ***  Signed: Charlton Haws 09/11/2020, 9:20 PM

## 2020-09-12 ENCOUNTER — Other Ambulatory Visit: Payer: Self-pay

## 2020-09-12 DIAGNOSIS — J449 Chronic obstructive pulmonary disease, unspecified: Secondary | ICD-10-CM

## 2020-09-12 DIAGNOSIS — R0600 Dyspnea, unspecified: Secondary | ICD-10-CM

## 2020-09-12 DIAGNOSIS — R0609 Other forms of dyspnea: Secondary | ICD-10-CM

## 2020-09-12 DIAGNOSIS — R6520 Severe sepsis without septic shock: Secondary | ICD-10-CM

## 2020-09-12 DIAGNOSIS — O85 Puerperal sepsis: Secondary | ICD-10-CM

## 2020-09-19 ENCOUNTER — Ambulatory Visit: Payer: 59 | Admitting: Cardiovascular Disease

## 2020-10-04 ENCOUNTER — Institutional Professional Consult (permissible substitution): Payer: 59 | Admitting: Pulmonary Disease

## 2020-10-05 ENCOUNTER — Other Ambulatory Visit (HOSPITAL_COMMUNITY): Payer: 59

## 2020-11-19 ENCOUNTER — Ambulatory Visit: Payer: 59 | Admitting: Cardiovascular Disease

## 2022-07-23 IMAGING — DX DG CHEST 2V
2 series · 2 of 2 positions shown · non-contrast
Comparison: Two-view chest x-ray 11/07/2012

CLINICAL DATA: Wheezing and hypoxia.  Cough.

EXAM:
CHEST - 2 VIEW

[chest pa]
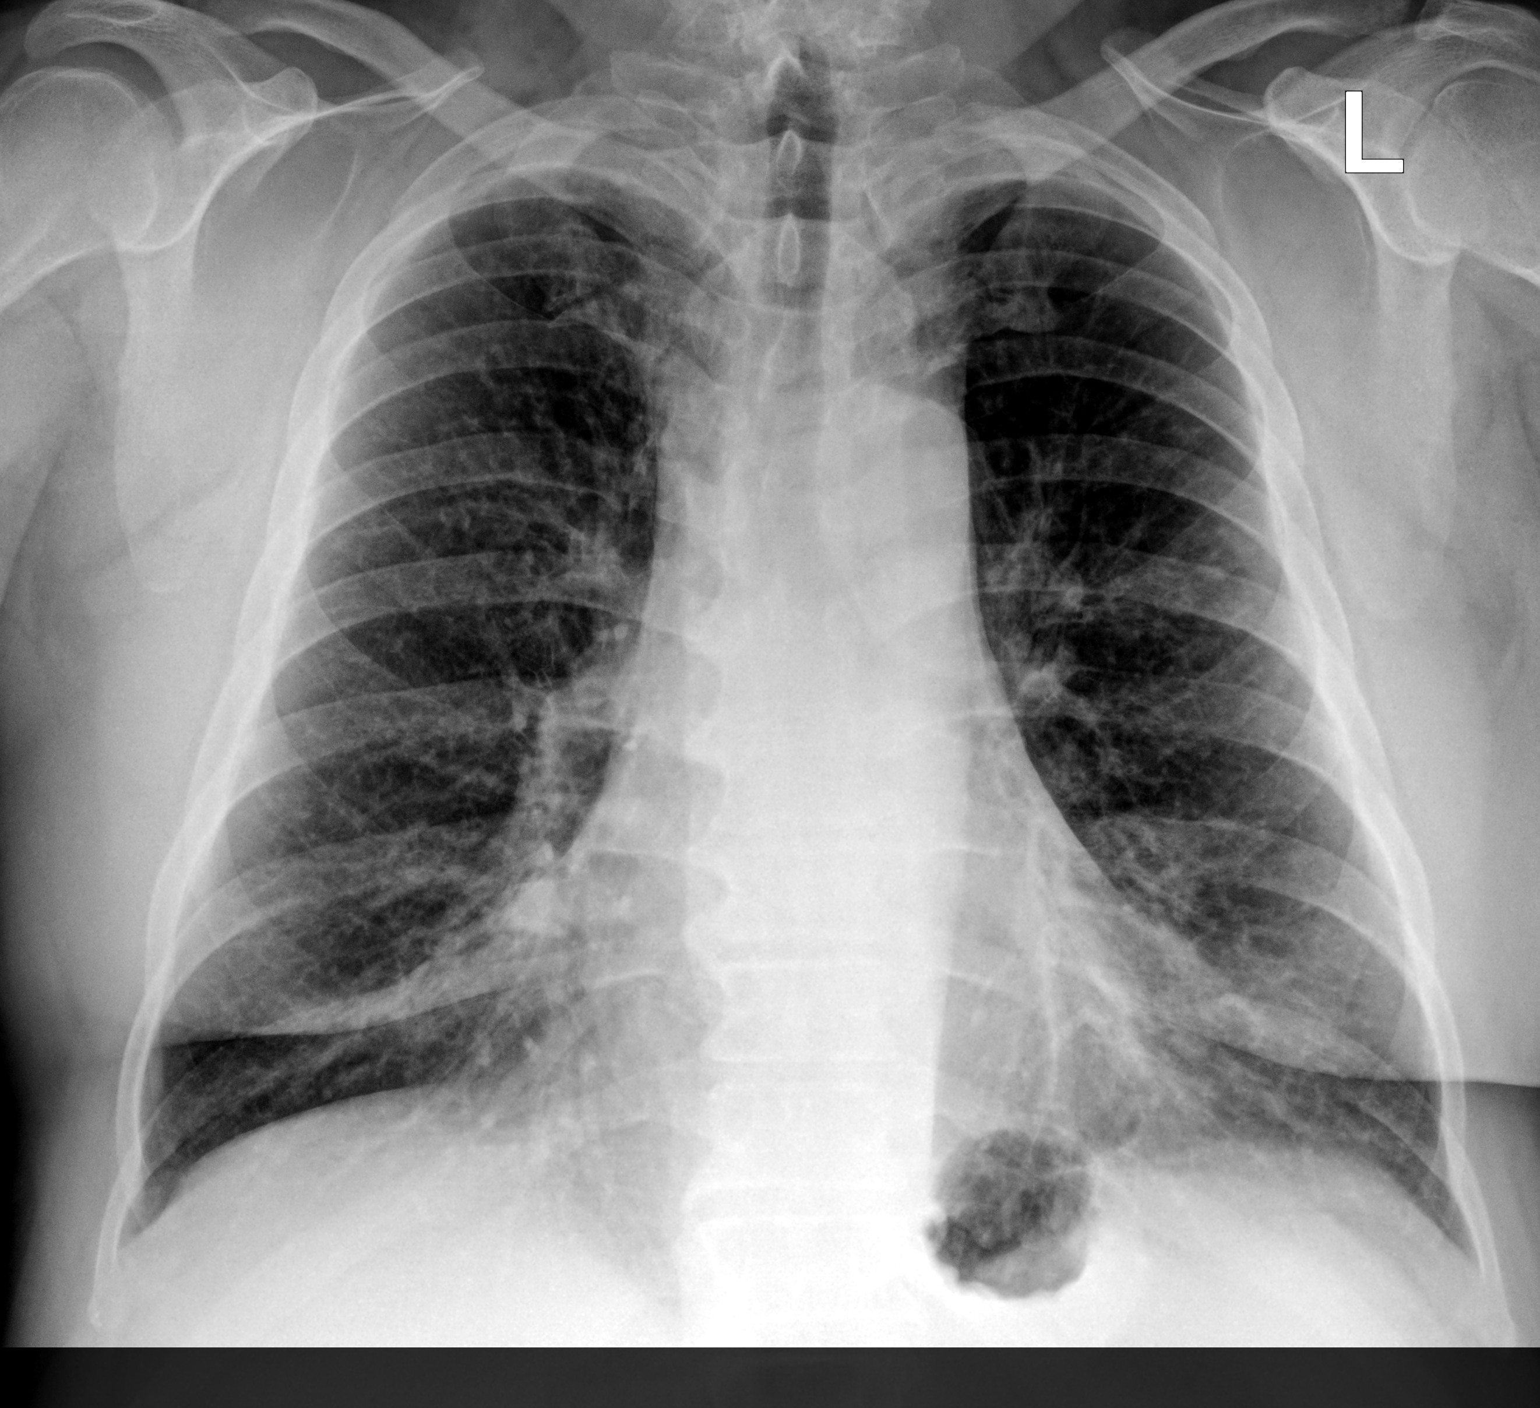

[chest lat]
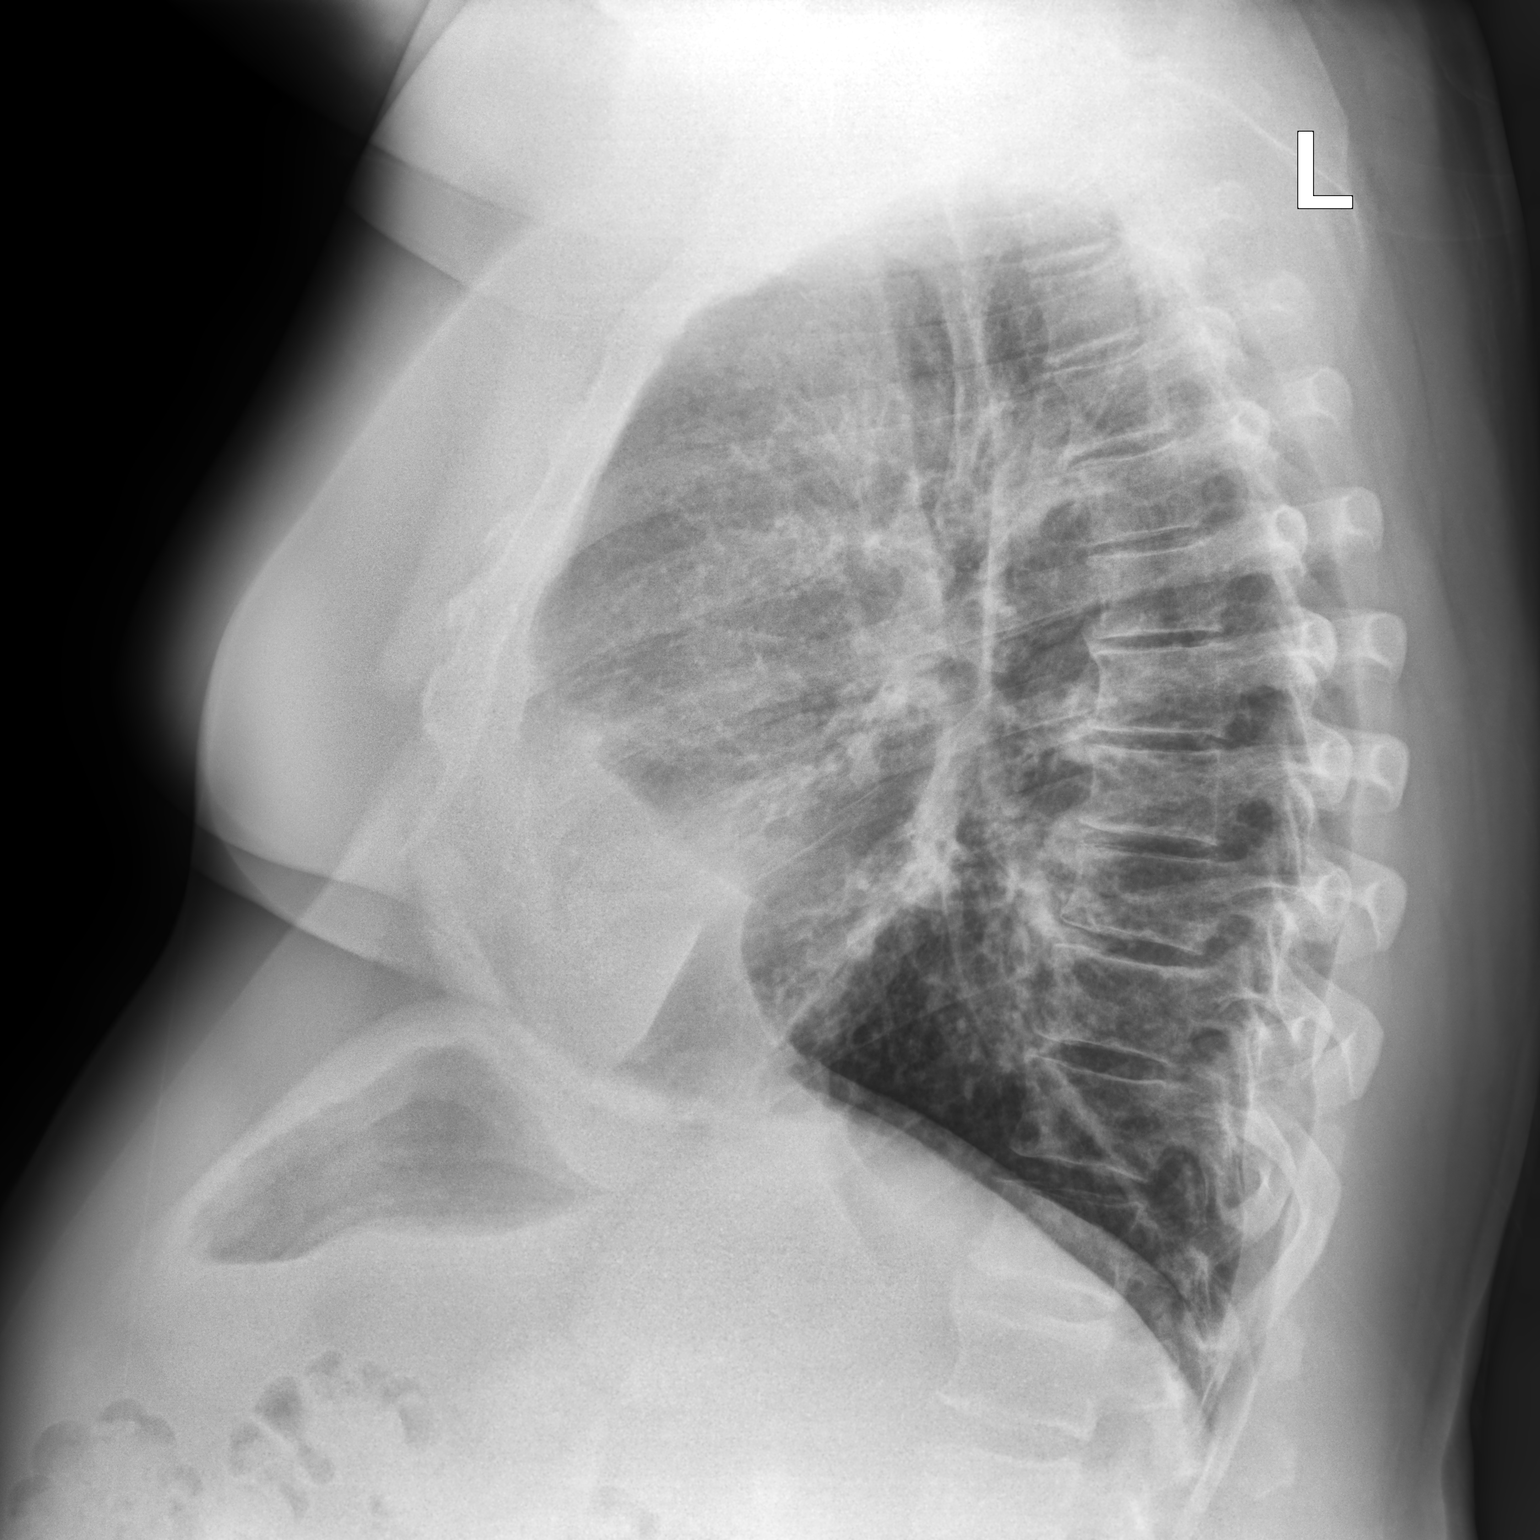

[2 of 2 positions shown; findings below may reference images not displayed]

FINDINGS: The heart is mildly enlarged, stable. Diffuse central airway
thickening is present. No focal airspace consolidation is present.
No edema or effusion is present.
IMPRESSION: 1. Stable cardiomegaly without failure.
2. Diffuse central airway thickening suggesting an acute viral
process or reactive airways disease.

## 2023-07-20 ENCOUNTER — Ambulatory Visit
Admission: EM | Admit: 2023-07-20 | Discharge: 2023-07-20 | Disposition: A | Discharge status: Home / Self Care | Payer: 59 | Attending: Nurse Practitioner | Admitting: Nurse Practitioner

## 2023-07-20 DIAGNOSIS — J101 Influenza due to other identified influenza virus with other respiratory manifestations: Secondary | ICD-10-CM

## 2023-07-20 DIAGNOSIS — J441 Chronic obstructive pulmonary disease with (acute) exacerbation: Secondary | ICD-10-CM | POA: Diagnosis not present

## 2023-07-20 LAB — POC COVID19/FLU A&B COMBO
Covid Antigen, POC: NEGATIVE
Influenza A Antigen, POC: POSITIVE — AB
Influenza B Antigen, POC: NEGATIVE

## 2023-07-20 MED ORDER — IPRATROPIUM-ALBUTEROL 0.5-2.5 (3) MG/3ML IN SOLN
3.0000 mL | Freq: Once | RESPIRATORY_TRACT | Status: AC
  Administered 2023-07-20: 3 mL via RESPIRATORY_TRACT

## 2023-07-20 MED ORDER — PROMETHAZINE-DM 6.25-15 MG/5ML PO SYRP: 5.0000 mL | ORAL_SOLUTION | Freq: Four times a day (QID) | ORAL | 0 refills | Status: AC | PRN

## 2023-07-20 MED ORDER — PREDNISONE 20 MG PO TABS: 40.0000 mg | ORAL_TABLET | Freq: Every day | ORAL | 0 refills | Status: AC

## 2023-07-20 MED ORDER — ALBUTEROL SULFATE HFA 108 (90 BASE) MCG/ACT IN AERS: 2.0000 | INHALATION_SPRAY | Freq: Four times a day (QID) | RESPIRATORY_TRACT | 0 refills | Status: AC | PRN

## 2023-07-20 MED ORDER — ALBUTEROL SULFATE (2.5 MG/3ML) 0.083% IN NEBU
2.5000 mg | INHALATION_SOLUTION | Freq: Once | RESPIRATORY_TRACT | Status: AC
  Administered 2023-07-20: 2.5 mg via RESPIRATORY_TRACT

## 2023-07-20 MED ORDER — ALBUTEROL SULFATE (2.5 MG/3ML) 0.083% IN NEBU: 2.5000 mg | INHALATION_SOLUTION | Freq: Four times a day (QID) | RESPIRATORY_TRACT | 0 refills | Status: AC | PRN

## 2023-07-20 MED ORDER — AZITHROMYCIN 250 MG PO TABS: 250.0000 mg | ORAL_TABLET | Freq: Every day | ORAL | 0 refills | Status: AC

## 2023-07-20 MED ORDER — OSELTAMIVIR PHOSPHATE 75 MG PO CAPS: 75.0000 mg | ORAL_CAPSULE | Freq: Two times a day (BID) | ORAL | 0 refills | Status: AC

## 2023-07-20 MED ORDER — METHYLPREDNISOLONE SODIUM SUCC 125 MG IJ SOLR
125.0000 mg | Freq: Once | INTRAMUSCULAR | Status: AC
  Administered 2023-07-20: 125 mg via INTRAMUSCULAR

## 2023-07-20 NOTE — ED Provider Notes (Signed)
 [facility]    CSN: [id number] Arrival date & time: 07/20/23  1224      History   Chief Complaint No chief complaint on file.   HPI George Eaton is a 63 y.o. male.   The history is provided by the patient and the spouse.   Presents for complaints of low-grade temperature, cough, and shortness of breath.  Patient states symptoms started "really over the past 2 days."  States temperature was around 99 today.  Patient also complains of wheezing.  States he developed shortness of breath with exertion.  Denies chills, headache, ear pain, nasal congestion, runny nose, chest pain, abdominal pain, nausea, vomiting, diarrhea, or rash.  Patient reports he has been taking over-the-counter cough and cold medications for his symptoms.  Reports prior history of COPD, but states he currently is not on any medications.  States that he has not smoked for more than 10 years.  Past Medical History:  Diagnosis Date   COPD (chronic obstructive pulmonary disease) (HCC)    Leukocytosis 01/20/2013    Patient Active Problem List   Diagnosis Date Noted   Leukocytosis 01/20/2013    History reviewed. No pertinent surgical history.     Home Medications    Prior to Admission medications   Medication Sig Start Date End Date Taking? Authorizing Provider  albuterol  (PROVENTIL ) (2.5 MG/3ML) 0.083% nebulizer solution Take 3 mLs (2.5 mg total) by nebulization every 6 (six) hours as needed for wheezing or shortness of breath. 07/20/23  Yes Leath-Warren, Belen Bowers, NP  albuterol  (VENTOLIN  HFA) 108 (90 Base) MCG/ACT inhaler Inhale 2 puffs into the lungs every 6 (six) hours as needed. 07/20/23  Yes Leath-Warren, Belen Bowers, NP  azithromycin  (ZITHROMAX ) 250 MG tablet Take 1 tablet (250 mg total) by mouth daily. Take first 2 tablets together, then 1 every day until finished. 07/20/23  Yes Leath-Warren, Belen Bowers, NP  oseltamivir  (TAMIFLU ) 75 MG capsule Take 1 capsule (75 mg total) by mouth every 12  (twelve) hours. 07/20/23  Yes Leath-Warren, Belen Bowers, NP  predniSONE  (DELTASONE ) 20 MG tablet Take 2 tablets (40 mg total) by mouth daily with breakfast for 5 days. 07/20/23 07/25/23 Yes Leath-Warren, Belen Bowers, NP  promethazine -dextromethorphan (PROMETHAZINE -DM) 6.25-15 MG/5ML syrup Take 5 mLs by mouth 4 (four) times daily as needed. 07/20/23  Yes Leath-Warren, Belen Bowers, NP  aspirin 81 MG tablet Take 81 mg by mouth daily.    [provider]  benzonatate  (TESSALON ) 100 MG capsule Take 1-2 capsules (100-200 mg total) by mouth 3 (three) times daily as needed for cough. 09/03/20   Buena Carmine, NP  carvedilol  (COREG ) 3.125 MG tablet Take 1 tablet (3.125 mg total) by mouth 2 (two) times daily with a meal. 09/03/20   Buena Carmine, NP  fish oil-omega-3 fatty acids 1000 MG capsule Take 2 g by mouth daily.    [provider]  furosemide  (LASIX ) 40 MG tablet Take 1 tablet (40 mg total) by mouth daily. 09/03/20   Buena Carmine, NP  ipratropium-albuterol  (DUONEB) 0.5-2.5 (3) MG/3ML SOLN Take 3 mLs by nebulization every 6 (six) hours as needed. 09/03/20   Buena Carmine, NP    Family History Family History  Problem Relation Age of Onset   Healthy Mother    Diabetes Father     Social History Social History   Tobacco Use   Smoking status: Former    Current packs/day: 0.00    Types: Cigarettes    Quit date: 09/04/2014  Years since quitting: 8.8   Smokeless tobacco: Never  Substance Use Topics   Alcohol use: No   Drug use: No     Allergies   Patient has no known allergies.   Review of Systems Review of Systems Per HPI  Physical Exam Triage Vital Signs ED Triage Vitals  Encounter Vitals Group     BP 07/20/23 1333 (!) 174/96     Systolic BP Percentile --      Diastolic BP Percentile --      Pulse Rate 07/20/23 1333 (!) 120     Resp 07/20/23 1333 (!) 26     Temp 07/20/23 1333 98.3 F (36.8 C)     Temp Source 07/20/23 1333 Oral     SpO2 07/20/23  1305 92 %     Weight --      Height --      Head Circumference --      Peak Flow --      Pain Score 07/20/23 1336 0     Pain Loc --      Pain Education --      Exclude from Growth Chart --    No data found.  Updated Vital Signs BP (!) 174/96 (BP Location: Right Arm)   Pulse (!) 120   Temp 98.3 F (36.8 C) (Oral)   Resp (!) 26   SpO2 (!) 88%   Visual Acuity Right Eye Distance:   Left Eye Distance:   Bilateral Distance:    Right Eye Near:   Left Eye Near:    Bilateral Near:     Physical Exam Vitals and nursing note reviewed.  Constitutional:      General: He is not in acute distress.    Appearance: Normal appearance.  HENT:     Head: Normocephalic.     Right Ear: Tympanic membrane, ear canal and external ear normal.     Left Ear: Tympanic membrane, ear canal and external ear normal.     Nose: Nose normal.     Mouth/Throat:     Mouth: Mucous membranes are moist.     Pharynx: Posterior oropharyngeal erythema present.  Eyes:     Extraocular Movements: Extraocular movements intact.     Conjunctiva/sclera: Conjunctivae normal.     Pupils: Pupils are equal, round, and reactive to light.  Cardiovascular:     Rate and Rhythm: Regular rhythm. Tachycardia present.     Pulses: Normal pulses.     Heart sounds: Normal heart sounds.  Pulmonary:     Effort: Pulmonary effort is normal. No respiratory distress.     Breath sounds: No stridor. Examination of the right-upper field reveals wheezing and rhonchi. Examination of the left-upper field reveals wheezing and rhonchi. Examination of the right-lower field reveals decreased breath sounds, wheezing and rhonchi. Examination of the left-lower field reveals decreased breath sounds, wheezing and rhonchi. Decreased breath sounds, wheezing and rhonchi present. No rales.     Comments: DuoNeb and albuterol  neb performed.  Patient reports improvement in his breathing.  Decreased rhonchi noted, increased wheezing present.  Patient is  speaking in complete sentences without difficulty. Abdominal:     General: Bowel sounds are normal.     Palpations: Abdomen is soft.     Tenderness: There is no abdominal tenderness.  Musculoskeletal:     Cervical back: Normal range of motion.  Lymphadenopathy:     Cervical: No cervical adenopathy.  Skin:    General: Skin is warm and dry.  Neurological:  General: No focal deficit present.     Mental Status: He is alert and oriented to person, place, and time.  Psychiatric:        Mood and Affect: Mood normal.        Behavior: Behavior normal.      UC Treatments / Results  Labs (all labs ordered are listed, but only abnormal results are displayed) Labs Reviewed  POC COVID19/FLU A&B COMBO - Abnormal; Notable for the following components:      Result Value   Influenza A Antigen, POC Positive (*)    All other components within normal limits    EKG   Radiology No results found.  Procedures Procedures (including critical care time)  Medications Ordered in UC Medications  ipratropium-albuterol  (DUONEB) 0.5-2.5 (3) MG/3ML nebulizer solution 3 mL (3 mLs Nebulization Given 07/20/23 1351)  methylPREDNISolone  sodium succinate (SOLU-MEDROL ) 125 mg/2 mL injection 125 mg (125 mg Intramuscular Given 07/20/23 1351)  albuterol  (PROVENTIL ) (2.5 MG/3ML) 0.083% nebulizer solution 2.5 mg (2.5 mg Nebulization Given 07/20/23 1433)    Initial Impression / Assessment and Plan / UC Course  I have reviewed the triage vital signs and the nursing notes.  Pertinent labs & imaging results that were available during my care of the patient were reviewed by me and considered in my medical decision making (see chart for details).  COVID/flu test was positive for influenza A.  Solu-Medrol  125 mg IM administered.  DuoNeb and albuterol  neb administered with some improvement of breath sounds in the posterior bilateral lower lung fields.  Advised patient that chest x-ray would be helpful for treatment,  advised patient he would need to go to [facility] to have the imaging performed.  Patient declined chest x-ray today.  Patient continues to have rhonchi, but rhonchi improved with cough.  Patient with underlying history of COPD, most likely exacerbated by influenza.  Will start patient on Tamiflu  75 mg twice daily for the next 5 days, azithromycin  250 mg, prednisone  40 mg daily for 5 days, and Promethazine  DM for his cough.  Patient also prescribed albuterol  nebulizer solution and an albuterol  inhaler for shortness of breath, cough, and wheezing.  Supportive care recommendations were provided and discussed with the patient to include fluids, rest, over-the-counter analgesics, and use of a humidifier at nighttime during sleep.  Discussion with patient regarding the necessity of having a PCP given his underlying health history.  Patient was given information for a PCP in the area who is excepting patients.  Discussed strict ER follow-up precautions with the patient.  Patient was in agreement with this plan of care and verbalizes understanding.  All questions were answered.  Patient stable for discharge.  Work note was provided.   Final Clinical Impressions(s) / UC Diagnoses   Final diagnoses:  Influenza A  COPD exacerbation (HCC)     Discharge Instructions      You have tested positive for Influenza A. Take medication as prescribed. Increase fluids and allow for plenty of rest. May take over-the-counter Tylenol as needed for pain, fever, or general discomfort. Recommend using a humidifier in your bedroom at nighttime during sleep and sleeping elevated on pillows while cough symptoms persist. You should remain home until you have been fever free for 24 hours with no medication. Go to the emergency department immediately if you continue to experience shortness of breath, wheezing, difficulty breathing, or if symptoms fail to improve. We have given you information for PCP in the area who is excepting  patients.  Please  call today to schedule an appointment. Follow-up as needed.     ED Prescriptions     Medication Sig Dispense Auth. Provider   oseltamivir  (TAMIFLU ) 75 MG capsule Take 1 capsule (75 mg total) by mouth every 12 (twelve) hours. 10 capsule Leath-Warren, Belen Bowers, NP   azithromycin  (ZITHROMAX ) 250 MG tablet Take 1 tablet (250 mg total) by mouth daily. Take first 2 tablets together, then 1 every day until finished. 6 tablet Leath-Warren, Belen Bowers, NP   predniSONE  (DELTASONE ) 20 MG tablet Take 2 tablets (40 mg total) by mouth daily with breakfast for 5 days. 10 tablet Leath-Warren, Belen Bowers, NP   promethazine -dextromethorphan (PROMETHAZINE -DM) 6.25-15 MG/5ML syrup Take 5 mLs by mouth 4 (four) times daily as needed. 118 mL Leath-Warren, Belen Bowers, NP   albuterol  (VENTOLIN  HFA) 108 (90 Base) MCG/ACT inhaler Inhale 2 puffs into the lungs every 6 (six) hours as needed. 8 g Leath-Warren, Belen Bowers, NP   albuterol  (PROVENTIL ) (2.5 MG/3ML) 0.083% nebulizer solution Take 3 mLs (2.5 mg total) by nebulization every 6 (six) hours as needed for wheezing or shortness of breath. 75 mL Leath-Warren, Belen Bowers, NP      PDMP not reviewed this encounter.   Hardy Lia, NP 07/20/23 1450

## 2023-07-20 NOTE — ED Notes (Signed)
 Performed a short walking challenge on pt. O2 ranged from 90-91%

## 2023-07-20 NOTE — ED Triage Notes (Signed)
 Pt reports to UC for coughing and SOB x 4 days   Has neb treatment but out of meds.

## 2023-07-20 NOTE — ED Notes (Signed)
 Check 3 different fingers on each hand and the O2 level ranged from 88-91%. Documented at 70

## 2023-07-20 NOTE — Discharge Instructions (Addendum)
 You have tested positive for Influenza A. Take medication as prescribed. Increase fluids and allow for plenty of rest. May take over-the-counter Tylenol as needed for pain, fever, or general discomfort. Recommend using a humidifier in your bedroom at nighttime during sleep and sleeping elevated on pillows while cough symptoms persist. You should remain home until you have been fever free for 24 hours with no medication. Go to the emergency department immediately if you continue to experience shortness of breath, wheezing, difficulty breathing, or if symptoms fail to improve. We have given you information for PCP in the area who is excepting patients.  Please call today to schedule an appointment. Follow-up as needed.
